# Patient Record
Sex: Male | Born: 1950
Health system: Southern US, Community
[De-identification: ages and names within clinical notes are randomized; demographics above are authoritative.]

## PROBLEM LIST (undated history)

## (undated) DIAGNOSIS — Z87898 Personal history of other specified conditions: Secondary | ICD-10-CM

## (undated) DIAGNOSIS — I712 Thoracic aortic aneurysm, without rupture: Secondary | ICD-10-CM

## (undated) DIAGNOSIS — E785 Hyperlipidemia, unspecified: Secondary | ICD-10-CM

## (undated) HISTORY — DX: Personal history of other specified conditions: Z87.898

## (undated) HISTORY — DX: Hyperlipidemia, unspecified: E78.5

## (undated) HISTORY — PX: HEMORROIDECTOMY: SUR656

## (undated) HISTORY — PX: OTHER SURGICAL HISTORY: SHX169

## (undated) HISTORY — PX: WISDOM TOOTH EXTRACTION: SHX21

---
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
## 2002-09-20 ENCOUNTER — Ambulatory Visit (HOSPITAL_COMMUNITY): Admission: RE | Admit: 2002-09-20 | Discharge: 2002-09-20 | Payer: Self-pay | Admitting: Gastroenterology

## 2014-12-20 DIAGNOSIS — R69 Illness, unspecified: Secondary | ICD-10-CM | POA: Diagnosis not present

## 2014-12-27 DIAGNOSIS — Z23 Encounter for immunization: Secondary | ICD-10-CM | POA: Diagnosis not present

## 2015-01-23 DIAGNOSIS — Z125 Encounter for screening for malignant neoplasm of prostate: Secondary | ICD-10-CM | POA: Diagnosis not present

## 2015-01-23 DIAGNOSIS — Z23 Encounter for immunization: Secondary | ICD-10-CM | POA: Diagnosis not present

## 2015-01-23 DIAGNOSIS — Z1389 Encounter for screening for other disorder: Secondary | ICD-10-CM | POA: Diagnosis not present

## 2015-01-23 DIAGNOSIS — E7800 Pure hypercholesterolemia, unspecified: Secondary | ICD-10-CM | POA: Diagnosis not present

## 2015-01-23 DIAGNOSIS — R7309 Other abnormal glucose: Secondary | ICD-10-CM | POA: Diagnosis not present

## 2015-01-23 DIAGNOSIS — R358 Other polyuria: Secondary | ICD-10-CM | POA: Diagnosis not present

## 2015-01-23 DIAGNOSIS — R350 Frequency of micturition: Secondary | ICD-10-CM | POA: Diagnosis not present

## 2015-04-11 DIAGNOSIS — D225 Melanocytic nevi of trunk: Secondary | ICD-10-CM | POA: Diagnosis not present

## 2015-04-11 DIAGNOSIS — L570 Actinic keratosis: Secondary | ICD-10-CM | POA: Diagnosis not present

## 2015-04-11 DIAGNOSIS — L723 Sebaceous cyst: Secondary | ICD-10-CM | POA: Diagnosis not present

## 2015-04-11 DIAGNOSIS — Z23 Encounter for immunization: Secondary | ICD-10-CM | POA: Diagnosis not present

## 2015-04-11 DIAGNOSIS — D1800 Hemangioma unspecified site: Secondary | ICD-10-CM | POA: Diagnosis not present

## 2015-04-11 DIAGNOSIS — L814 Other melanin hyperpigmentation: Secondary | ICD-10-CM | POA: Diagnosis not present

## 2015-04-11 DIAGNOSIS — L821 Other seborrheic keratosis: Secondary | ICD-10-CM | POA: Diagnosis not present

## 2015-05-03 DIAGNOSIS — R69 Illness, unspecified: Secondary | ICD-10-CM | POA: Diagnosis not present

## 2015-05-04 DIAGNOSIS — R69 Illness, unspecified: Secondary | ICD-10-CM | POA: Diagnosis not present

## 2015-09-05 DIAGNOSIS — R69 Illness, unspecified: Secondary | ICD-10-CM | POA: Diagnosis not present

## 2015-12-27 DIAGNOSIS — R69 Illness, unspecified: Secondary | ICD-10-CM | POA: Diagnosis not present

## 2015-12-28 DIAGNOSIS — R69 Illness, unspecified: Secondary | ICD-10-CM | POA: Diagnosis not present

## 2016-01-23 DIAGNOSIS — E7800 Pure hypercholesterolemia, unspecified: Secondary | ICD-10-CM | POA: Diagnosis not present

## 2016-01-23 DIAGNOSIS — Z23 Encounter for immunization: Secondary | ICD-10-CM | POA: Diagnosis not present

## 2016-01-23 DIAGNOSIS — R7309 Other abnormal glucose: Secondary | ICD-10-CM | POA: Diagnosis not present

## 2016-01-23 DIAGNOSIS — Z1389 Encounter for screening for other disorder: Secondary | ICD-10-CM | POA: Diagnosis not present

## 2016-01-23 DIAGNOSIS — Z125 Encounter for screening for malignant neoplasm of prostate: Secondary | ICD-10-CM | POA: Diagnosis not present

## 2016-01-23 DIAGNOSIS — R350 Frequency of micturition: Secondary | ICD-10-CM | POA: Diagnosis not present

## 2016-01-23 DIAGNOSIS — R358 Other polyuria: Secondary | ICD-10-CM | POA: Diagnosis not present

## 2016-04-30 DIAGNOSIS — R69 Illness, unspecified: Secondary | ICD-10-CM | POA: Diagnosis not present

## 2016-05-20 DIAGNOSIS — L7211 Pilar cyst: Secondary | ICD-10-CM | POA: Diagnosis not present

## 2016-05-20 DIAGNOSIS — D225 Melanocytic nevi of trunk: Secondary | ICD-10-CM | POA: Diagnosis not present

## 2016-05-20 DIAGNOSIS — L821 Other seborrheic keratosis: Secondary | ICD-10-CM | POA: Diagnosis not present

## 2016-05-20 DIAGNOSIS — Q825 Congenital non-neoplastic nevus: Secondary | ICD-10-CM | POA: Diagnosis not present

## 2016-05-20 DIAGNOSIS — D1800 Hemangioma unspecified site: Secondary | ICD-10-CM | POA: Diagnosis not present

## 2016-05-20 DIAGNOSIS — L814 Other melanin hyperpigmentation: Secondary | ICD-10-CM | POA: Diagnosis not present

## 2016-07-10 DIAGNOSIS — Z79899 Other long term (current) drug therapy: Secondary | ICD-10-CM | POA: Diagnosis not present

## 2016-07-10 DIAGNOSIS — E785 Hyperlipidemia, unspecified: Secondary | ICD-10-CM | POA: Diagnosis not present

## 2016-07-10 DIAGNOSIS — N329 Bladder disorder, unspecified: Secondary | ICD-10-CM | POA: Diagnosis not present

## 2016-07-10 DIAGNOSIS — Z87891 Personal history of nicotine dependence: Secondary | ICD-10-CM | POA: Diagnosis not present

## 2016-07-10 DIAGNOSIS — Z0000 Encounter for general adult medical examination without abnormal findings: Secondary | ICD-10-CM | POA: Diagnosis not present

## 2016-08-27 DIAGNOSIS — R69 Illness, unspecified: Secondary | ICD-10-CM | POA: Diagnosis not present

## 2016-11-20 DIAGNOSIS — R69 Illness, unspecified: Secondary | ICD-10-CM | POA: Diagnosis not present

## 2016-12-31 DIAGNOSIS — R69 Illness, unspecified: Secondary | ICD-10-CM | POA: Diagnosis not present

## 2017-01-23 DIAGNOSIS — Z125 Encounter for screening for malignant neoplasm of prostate: Secondary | ICD-10-CM | POA: Diagnosis not present

## 2017-01-23 DIAGNOSIS — R358 Other polyuria: Secondary | ICD-10-CM | POA: Diagnosis not present

## 2017-01-23 DIAGNOSIS — L723 Sebaceous cyst: Secondary | ICD-10-CM | POA: Diagnosis not present

## 2017-01-23 DIAGNOSIS — Z1389 Encounter for screening for other disorder: Secondary | ICD-10-CM | POA: Diagnosis not present

## 2017-01-23 DIAGNOSIS — R7309 Other abnormal glucose: Secondary | ICD-10-CM | POA: Diagnosis not present

## 2017-01-23 DIAGNOSIS — R350 Frequency of micturition: Secondary | ICD-10-CM | POA: Diagnosis not present

## 2017-01-23 DIAGNOSIS — E7800 Pure hypercholesterolemia, unspecified: Secondary | ICD-10-CM | POA: Diagnosis not present

## 2017-01-30 DIAGNOSIS — L720 Epidermal cyst: Secondary | ICD-10-CM | POA: Diagnosis not present

## 2017-02-18 ENCOUNTER — Other Ambulatory Visit: Payer: Self-pay | Admitting: General Surgery

## 2017-02-18 DIAGNOSIS — L723 Sebaceous cyst: Secondary | ICD-10-CM | POA: Diagnosis not present

## 2017-02-18 DIAGNOSIS — L720 Epidermal cyst: Secondary | ICD-10-CM | POA: Diagnosis not present

## 2017-02-18 DIAGNOSIS — L7212 Trichodermal cyst: Secondary | ICD-10-CM | POA: Diagnosis not present

## 2017-04-17 DIAGNOSIS — Z961 Presence of intraocular lens: Secondary | ICD-10-CM | POA: Diagnosis not present

## 2017-05-13 DIAGNOSIS — R69 Illness, unspecified: Secondary | ICD-10-CM | POA: Diagnosis not present

## 2017-05-21 DIAGNOSIS — L570 Actinic keratosis: Secondary | ICD-10-CM | POA: Diagnosis not present

## 2017-05-21 DIAGNOSIS — L821 Other seborrheic keratosis: Secondary | ICD-10-CM | POA: Diagnosis not present

## 2017-05-21 DIAGNOSIS — Q825 Congenital non-neoplastic nevus: Secondary | ICD-10-CM | POA: Diagnosis not present

## 2017-05-21 DIAGNOSIS — D1800 Hemangioma unspecified site: Secondary | ICD-10-CM | POA: Diagnosis not present

## 2017-05-21 DIAGNOSIS — L814 Other melanin hyperpigmentation: Secondary | ICD-10-CM | POA: Diagnosis not present

## 2017-05-21 DIAGNOSIS — D225 Melanocytic nevi of trunk: Secondary | ICD-10-CM | POA: Diagnosis not present

## 2017-09-29 DIAGNOSIS — H903 Sensorineural hearing loss, bilateral: Secondary | ICD-10-CM | POA: Diagnosis not present

## 2017-10-02 DIAGNOSIS — R69 Illness, unspecified: Secondary | ICD-10-CM | POA: Diagnosis not present

## 2018-01-03 DIAGNOSIS — R69 Illness, unspecified: Secondary | ICD-10-CM | POA: Diagnosis not present

## 2018-01-27 DIAGNOSIS — Z1389 Encounter for screening for other disorder: Secondary | ICD-10-CM | POA: Diagnosis not present

## 2018-01-27 DIAGNOSIS — R358 Other polyuria: Secondary | ICD-10-CM | POA: Diagnosis not present

## 2018-01-27 DIAGNOSIS — E7800 Pure hypercholesterolemia, unspecified: Secondary | ICD-10-CM | POA: Diagnosis not present

## 2018-01-27 DIAGNOSIS — Z125 Encounter for screening for malignant neoplasm of prostate: Secondary | ICD-10-CM | POA: Diagnosis not present

## 2018-01-27 DIAGNOSIS — R350 Frequency of micturition: Secondary | ICD-10-CM | POA: Diagnosis not present

## 2018-01-27 DIAGNOSIS — R7309 Other abnormal glucose: Secondary | ICD-10-CM | POA: Diagnosis not present

## 2018-02-12 DIAGNOSIS — R69 Illness, unspecified: Secondary | ICD-10-CM | POA: Diagnosis not present

## 2018-03-09 ENCOUNTER — Other Ambulatory Visit: Payer: Self-pay | Admitting: Family Medicine

## 2018-03-09 ENCOUNTER — Other Ambulatory Visit (HOSPITAL_COMMUNITY): Payer: Self-pay | Admitting: Family Medicine

## 2018-03-09 DIAGNOSIS — Z8249 Family history of ischemic heart disease and other diseases of the circulatory system: Secondary | ICD-10-CM

## 2018-03-11 ENCOUNTER — Encounter (INDEPENDENT_AMBULATORY_CARE_PROVIDER_SITE_OTHER): Payer: Self-pay

## 2018-03-11 ENCOUNTER — Ambulatory Visit (HOSPITAL_COMMUNITY): Payer: Medicare HMO | Attending: Cardiology

## 2018-03-11 DIAGNOSIS — I712 Thoracic aortic aneurysm, without rupture: Secondary | ICD-10-CM | POA: Insufficient documentation

## 2018-03-11 DIAGNOSIS — E785 Hyperlipidemia, unspecified: Secondary | ICD-10-CM | POA: Insufficient documentation

## 2018-03-11 DIAGNOSIS — Z8249 Family history of ischemic heart disease and other diseases of the circulatory system: Secondary | ICD-10-CM | POA: Insufficient documentation

## 2018-03-17 ENCOUNTER — Other Ambulatory Visit: Payer: Self-pay | Admitting: Family Medicine

## 2018-03-17 DIAGNOSIS — R931 Abnormal findings on diagnostic imaging of heart and coronary circulation: Secondary | ICD-10-CM

## 2018-03-20 ENCOUNTER — Ambulatory Visit
Admission: RE | Admit: 2018-03-20 | Discharge: 2018-03-20 | Disposition: A | Discharge status: Home / Self Care | Payer: Medicare HMO | Source: Ambulatory Visit | Attending: Family Medicine | Admitting: Family Medicine

## 2018-03-20 DIAGNOSIS — R931 Abnormal findings on diagnostic imaging of heart and coronary circulation: Secondary | ICD-10-CM

## 2018-03-20 DIAGNOSIS — I712 Thoracic aortic aneurysm, without rupture: Secondary | ICD-10-CM | POA: Diagnosis not present

## 2018-03-20 MED ORDER — IOPAMIDOL (ISOVUE-370) INJECTION 76%
75.0000 mL | Freq: Once | INTRAVENOUS | Status: AC | PRN
  Administered 2018-03-20: 75 mL via INTRAVENOUS

## 2018-04-15 ENCOUNTER — Institutional Professional Consult (permissible substitution): Payer: Medicare HMO | Admitting: Cardiothoracic Surgery

## 2018-04-15 ENCOUNTER — Other Ambulatory Visit: Payer: Self-pay

## 2018-04-15 ENCOUNTER — Encounter: Payer: Self-pay | Admitting: Cardiothoracic Surgery

## 2018-04-15 VITALS — BP 129/90 | HR 70 | Resp 16 | Ht 73.0 in | Wt 210.0 lb

## 2018-04-15 DIAGNOSIS — I712 Thoracic aortic aneurysm, without rupture, unspecified: Secondary | ICD-10-CM
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
## 2018-04-16 ENCOUNTER — Encounter: Payer: Self-pay | Admitting: Family Medicine

## 2018-04-20 DIAGNOSIS — Z961 Presence of intraocular lens: Secondary | ICD-10-CM | POA: Diagnosis not present

## 2018-04-23 DIAGNOSIS — Z86010 Personal history of colonic polyps: Secondary | ICD-10-CM | POA: Diagnosis not present

## 2018-04-23 DIAGNOSIS — K5730 Diverticulosis of large intestine without perforation or abscess without bleeding: Secondary | ICD-10-CM | POA: Diagnosis not present

## 2018-04-23 DIAGNOSIS — Z1211 Encounter for screening for malignant neoplasm of colon: Secondary | ICD-10-CM | POA: Diagnosis not present

## 2018-07-22 DIAGNOSIS — Z0100 Encounter for examination of eyes and vision without abnormal findings: Secondary | ICD-10-CM | POA: Diagnosis not present

## 2018-08-18 ENCOUNTER — Other Ambulatory Visit: Payer: Self-pay | Admitting: Cardiothoracic Surgery

## 2018-08-18 DIAGNOSIS — I712 Thoracic aortic aneurysm, without rupture, unspecified: Secondary | ICD-10-CM

## 2018-08-25 DIAGNOSIS — R69 Illness, unspecified: Secondary | ICD-10-CM | POA: Diagnosis not present

## 2018-09-26 ENCOUNTER — Encounter: Payer: Self-pay | Admitting: Cardiothoracic Surgery

## 2018-09-30 ENCOUNTER — Ambulatory Visit
Admission: RE | Admit: 2018-09-30 | Discharge: 2018-09-30 | Disposition: A | Discharge status: Home / Self Care | Payer: Medicare HMO | Source: Ambulatory Visit | Attending: Cardiothoracic Surgery | Admitting: Cardiothoracic Surgery

## 2018-09-30 ENCOUNTER — Encounter: Payer: Self-pay | Admitting: Cardiothoracic Surgery

## 2018-09-30 ENCOUNTER — Other Ambulatory Visit: Payer: Self-pay

## 2018-09-30 ENCOUNTER — Ambulatory Visit: Payer: Medicare HMO | Admitting: Cardiothoracic Surgery

## 2018-09-30 DIAGNOSIS — I712 Thoracic aortic aneurysm, without rupture, unspecified: Secondary | ICD-10-CM

## 2018-09-30 DIAGNOSIS — I7121 Aneurysm of the ascending aorta, without rupture: Secondary | ICD-10-CM | POA: Insufficient documentation

## 2018-09-30 HISTORY — DX: Aneurysm of the ascending aorta, without rupture: I71.21

## 2018-09-30 HISTORY — DX: Thoracic aortic aneurysm, without rupture: I71.2

## 2018-09-30 MED ORDER — IOPAMIDOL (ISOVUE-370) INJECTION 76%
75.0000 mL | Freq: Once | INTRAVENOUS | Status: AC | PRN
  Administered 2018-09-30: 75 mL via INTRAVENOUS

## 2018-09-30 NOTE — Progress Notes (Signed)
PCP is Gaynelle Arabian, MD Referring Provider is Gaynelle Arabian, MD  Chief Complaint  Patient presents with  . Thoracic Aortic Aneurysm    5 month f/u with CTA chest    HPI: The patient returns with CTA of the aorta for 54-month follow-up of a 4.9 cm asymptomatic fusiform aneurysm, first noted earlier this year.  He was first noted on a screening echocardiogram.  The ascending aorta shows no thickening hematoma or ulceration.  The aortic valve on echo was trileaflet without aortic insufficiency.  Left ventricular function is normal.  Patient does not have hypertension and does not smoke.  Patient  fhas  BSA of 2.3 and risk for dissection is approximately 1%.  Today's CT scan images are personally reviewed.  There is been no change in the diameter of his ascending aorta remains at 4.9 cm.  Past Medical History:  Diagnosis Date  . H/O urinary frequency   . Hyperlipidemia     Past Surgical History:  Procedure Laterality Date  . Excision of epidermoid cyst on neck    . HEMORROIDECTOMY    . WISDOM TOOTH EXTRACTION      Family History  Problem Relation Age of Onset  . Anemia Mother   . Leukemia Mother   . Arthritis Mother   . Cancer Sister     Social History Social History   Tobacco Use  . Smoking status: Former Smoker    Packs/day: 1.00    Years: 19.00    Pack years: 19.00    Types: Cigarettes    Quit date: 04/15/1982    Years since quitting: 36.4  . Smokeless tobacco: Never Used  Substance Use Topics  . Alcohol use: Yes    Comment: FEW BEERS A WEEK  . Drug use: Not on file    Current Outpatient Medications  Medication Sig Dispense Refill  . aspirin EC 81 MG tablet Take 81 mg by mouth daily.    Marland Kitchen oxybutynin (DITROPAN) 5 MG tablet Take 5 mg by mouth 2 (two) times daily.    . simvastatin (ZOCOR) 40 MG tablet Take 40 mg by mouth daily.    Marland Kitchen VITAMIN D PO Take by mouth 3 (three) times daily.     No current facility-administered medications for this visit.     No  Known Allergies  Review of Systems  No chest pain No shortness of breath Recently retired Engineer, manufacturing systems frequently No leg pain ankle swelling or claudication No abdominal pain.  BP 131/80 (BP Location: Right Arm, Patient Position: Sitting, Cuff Size: Normal)   Pulse (!) 58   Resp 18   Ht 6\' 1"  (1.854 m)   Wt 220 lb (99.8 kg)   SpO2 93% Comment: RA  BMI 29.03 kg/m  Physical Exam      Exam    General- alert and comfortable    Neck- no JVD, no cervical adenopathy palpable, no carotid bruit   Lungs- clear without rales, wheezes   Cor- regular rate and rhythm, no murmur , gallop   Abdomen- soft, non-tender   Extremities - warm, non-tender, minimal edema   Neuro- oriented, appropriate, no focal weakness   Diagnostic Tests: CT images personally reviewed showing the ascending aorta to measure 4.9 cm in diameter, consistent with a moderate fusiform aneurysm.  Impression: Asymptomatic moderate 4.9 cm fusiform aneurysm which is probably been stable for several years. Risk for dissection is.  very low approximately 1% Patient does not meet indications for surgery unless diameter exceeds 5.5. Best therapy is  good blood pressure control maintaining systolic pressure less than 150 and serial surveillance scans Patient will monitor his blood pressure and keep records for his primary care physician. I will plan a follow-up CT scan with office visit in a year.  The patient understands i that he should avoid the antibiotic Levaquin since it has a detrimental effect on connective tissue and could hasten the expansion of his aortic diameter.  He will also avoid heavy lifting. Return in 1 year with CTA of thoracic aorta.   Len Childs, MD [facility] [phone]

## 2018-10-13 DIAGNOSIS — R69 Illness, unspecified: Secondary | ICD-10-CM | POA: Diagnosis not present

## 2018-11-16 ENCOUNTER — Other Ambulatory Visit: Payer: Self-pay | Admitting: Registered"

## 2018-11-16 DIAGNOSIS — Z20822 Contact with and (suspected) exposure to covid-19: Secondary | ICD-10-CM

## 2018-11-16 DIAGNOSIS — Z20828 Contact with and (suspected) exposure to other viral communicable diseases: Secondary | ICD-10-CM | POA: Diagnosis not present

## 2018-11-18 LAB — NOVEL CORONAVIRUS, NAA: SARS-CoV-2, NAA: NOT DETECTED

## 2019-01-14 DIAGNOSIS — R69 Illness, unspecified: Secondary | ICD-10-CM | POA: Diagnosis not present

## 2019-02-03 DIAGNOSIS — Z0000 Encounter for general adult medical examination without abnormal findings: Secondary | ICD-10-CM | POA: Diagnosis not present

## 2019-02-03 DIAGNOSIS — E7800 Pure hypercholesterolemia, unspecified: Secondary | ICD-10-CM | POA: Diagnosis not present

## 2019-02-03 DIAGNOSIS — Z125 Encounter for screening for malignant neoplasm of prostate: Secondary | ICD-10-CM | POA: Diagnosis not present

## 2019-02-03 DIAGNOSIS — R7309 Other abnormal glucose: Secondary | ICD-10-CM | POA: Diagnosis not present

## 2019-02-03 DIAGNOSIS — Z79899 Other long term (current) drug therapy: Secondary | ICD-10-CM | POA: Diagnosis not present

## 2019-02-03 DIAGNOSIS — R350 Frequency of micturition: Secondary | ICD-10-CM | POA: Diagnosis not present

## 2019-02-04 DIAGNOSIS — Z125 Encounter for screening for malignant neoplasm of prostate: Secondary | ICD-10-CM | POA: Diagnosis not present

## 2019-02-04 DIAGNOSIS — Z79899 Other long term (current) drug therapy: Secondary | ICD-10-CM | POA: Diagnosis not present

## 2019-02-04 DIAGNOSIS — E7800 Pure hypercholesterolemia, unspecified: Secondary | ICD-10-CM | POA: Diagnosis not present

## 2019-02-04 DIAGNOSIS — R7309 Other abnormal glucose: Secondary | ICD-10-CM | POA: Diagnosis not present

## 2019-02-15 DIAGNOSIS — R69 Illness, unspecified: Secondary | ICD-10-CM | POA: Diagnosis not present

## 2019-02-23 DIAGNOSIS — L570 Actinic keratosis: Secondary | ICD-10-CM | POA: Diagnosis not present

## 2019-02-23 DIAGNOSIS — Q825 Congenital non-neoplastic nevus: Secondary | ICD-10-CM | POA: Diagnosis not present

## 2019-02-23 DIAGNOSIS — Z23 Encounter for immunization: Secondary | ICD-10-CM | POA: Diagnosis not present

## 2019-02-23 DIAGNOSIS — L578 Other skin changes due to chronic exposure to nonionizing radiation: Secondary | ICD-10-CM | POA: Diagnosis not present

## 2019-02-23 DIAGNOSIS — L814 Other melanin hyperpigmentation: Secondary | ICD-10-CM | POA: Diagnosis not present

## 2019-02-23 DIAGNOSIS — D225 Melanocytic nevi of trunk: Secondary | ICD-10-CM | POA: Diagnosis not present

## 2019-02-23 DIAGNOSIS — L821 Other seborrheic keratosis: Secondary | ICD-10-CM | POA: Diagnosis not present

## 2019-03-10 ENCOUNTER — Ambulatory Visit: Payer: Medicare HMO

## 2019-03-18 ENCOUNTER — Ambulatory Visit: Payer: Medicare HMO | Attending: Internal Medicine

## 2019-03-18 DIAGNOSIS — Z23 Encounter for immunization: Secondary | ICD-10-CM | POA: Insufficient documentation

## 2019-03-18 NOTE — Progress Notes (Signed)
   Covid-19 Vaccination Clinic  Name:  Shaun Vincent    MRN: [id number] DOB: February 27, 1950  03/18/2019  Shaun Vincent was observed post Covid-19 immunization for 15 minutes without incidence. He was provided with Vaccine Information Sheet and instruction to access the V-Safe system.   Shaun Vincent was instructed to call 911 with any severe reactions post vaccine: Marland Kitchen Difficulty breathing  . Swelling of your face and throat  . A fast heartbeat  . A bad rash all over your body  . Dizziness and weakness    Immunizations Administered    Name Date Dose VIS Date Route   Pfizer COVID-19 Vaccine 03/18/2019  3:01 PM 0.3 mL 01/22/2019 Intramuscular   Manufacturer: [place]   Lot: [id number]   [place]: [id number]

## 2019-03-27 ENCOUNTER — Ambulatory Visit: Payer: Medicare HMO

## 2019-04-08 ENCOUNTER — Ambulatory Visit (INDEPENDENT_AMBULATORY_CARE_PROVIDER_SITE_OTHER): Payer: Medicare HMO | Admitting: Psychology

## 2019-04-08 DIAGNOSIS — F411 Generalized anxiety disorder: Secondary | ICD-10-CM | POA: Diagnosis not present

## 2019-04-08 DIAGNOSIS — R69 Illness, unspecified: Secondary | ICD-10-CM | POA: Diagnosis not present

## 2019-04-13 ENCOUNTER — Ambulatory Visit: Payer: Medicare HMO | Attending: Internal Medicine

## 2019-04-13 DIAGNOSIS — Z23 Encounter for immunization: Secondary | ICD-10-CM | POA: Insufficient documentation

## 2019-04-13 NOTE — Progress Notes (Signed)
   Covid-19 Vaccination Clinic  Name:  Shaun Vincent    MRN: [id number] DOB: 11-Apr-1950  04/13/2019  [name] was observed post Covid-19 immunization for 15 minutes without incident. He was provided with Vaccine Information Sheet and instruction to access the V-Safe system.   [name] was instructed to call 911 with any severe reactions post vaccine: Marland Kitchen Difficulty breathing  . Swelling of face and throat  . A fast heartbeat  . A bad rash all over body  . Dizziness and weakness   Immunizations Administered    Name Date Dose VIS Date Route   Pfizer COVID-19 Vaccine 04/13/2019  9:50 AM 0.3 mL 01/22/2019 Intramuscular   Manufacturer: [place]   Lot: [id number]   [place]: [id number]

## 2019-04-22 ENCOUNTER — Ambulatory Visit (INDEPENDENT_AMBULATORY_CARE_PROVIDER_SITE_OTHER): Payer: Medicare HMO | Admitting: Psychology

## 2019-04-22 DIAGNOSIS — F411 Generalized anxiety disorder: Secondary | ICD-10-CM

## 2019-04-22 DIAGNOSIS — R69 Illness, unspecified: Secondary | ICD-10-CM | POA: Diagnosis not present

## 2019-05-05 ENCOUNTER — Ambulatory Visit (INDEPENDENT_AMBULATORY_CARE_PROVIDER_SITE_OTHER): Payer: Medicare HMO | Admitting: Psychology

## 2019-05-05 DIAGNOSIS — F411 Generalized anxiety disorder: Secondary | ICD-10-CM | POA: Diagnosis not present

## 2019-05-05 DIAGNOSIS — R69 Illness, unspecified: Secondary | ICD-10-CM | POA: Diagnosis not present

## 2019-05-19 ENCOUNTER — Ambulatory Visit (INDEPENDENT_AMBULATORY_CARE_PROVIDER_SITE_OTHER): Payer: Medicare HMO | Admitting: Psychology

## 2019-05-19 DIAGNOSIS — R69 Illness, unspecified: Secondary | ICD-10-CM | POA: Diagnosis not present

## 2019-05-19 DIAGNOSIS — F411 Generalized anxiety disorder: Secondary | ICD-10-CM

## 2019-05-20 DIAGNOSIS — R69 Illness, unspecified: Secondary | ICD-10-CM | POA: Diagnosis not present

## 2019-06-02 ENCOUNTER — Ambulatory Visit: Payer: Medicare HMO | Admitting: Psychology

## 2019-06-17 ENCOUNTER — Ambulatory Visit (INDEPENDENT_AMBULATORY_CARE_PROVIDER_SITE_OTHER): Payer: Medicare HMO | Admitting: Psychology

## 2019-06-17 DIAGNOSIS — F419 Anxiety disorder, unspecified: Secondary | ICD-10-CM | POA: Diagnosis not present

## 2019-06-17 DIAGNOSIS — R69 Illness, unspecified: Secondary | ICD-10-CM | POA: Diagnosis not present

## 2019-07-19 ENCOUNTER — Ambulatory Visit: Payer: Medicare HMO | Admitting: Psychology

## 2019-07-21 DIAGNOSIS — R69 Illness, unspecified: Secondary | ICD-10-CM | POA: Diagnosis not present

## 2019-07-29 DIAGNOSIS — R69 Illness, unspecified: Secondary | ICD-10-CM | POA: Diagnosis not present

## 2019-08-09 ENCOUNTER — Ambulatory Visit (INDEPENDENT_AMBULATORY_CARE_PROVIDER_SITE_OTHER): Payer: Medicare HMO | Admitting: Psychology

## 2019-08-09 DIAGNOSIS — R69 Illness, unspecified: Secondary | ICD-10-CM | POA: Diagnosis not present

## 2019-08-09 DIAGNOSIS — F419 Anxiety disorder, unspecified: Secondary | ICD-10-CM

## 2019-08-30 ENCOUNTER — Ambulatory Visit: Payer: Medicare HMO | Admitting: Psychology

## 2019-09-13 DIAGNOSIS — R69 Illness, unspecified: Secondary | ICD-10-CM | POA: Diagnosis not present

## 2019-10-05 ENCOUNTER — Ambulatory Visit: Payer: Self-pay | Admitting: Psychology

## 2019-10-13 ENCOUNTER — Other Ambulatory Visit: Payer: Self-pay | Admitting: *Deleted

## 2019-10-13 DIAGNOSIS — I712 Thoracic aortic aneurysm, without rupture, unspecified: Secondary | ICD-10-CM

## 2019-10-25 DIAGNOSIS — H43812 Vitreous degeneration, left eye: Secondary | ICD-10-CM | POA: Diagnosis not present

## 2019-11-09 DIAGNOSIS — Z23 Encounter for immunization: Secondary | ICD-10-CM | POA: Diagnosis not present

## 2019-11-09 DIAGNOSIS — R69 Illness, unspecified: Secondary | ICD-10-CM | POA: Diagnosis not present

## 2019-11-10 ENCOUNTER — Other Ambulatory Visit: Payer: Self-pay

## 2019-11-10 ENCOUNTER — Ambulatory Visit
Admission: RE | Admit: 2019-11-10 | Discharge: 2019-11-10 | Disposition: A | Discharge status: Home / Self Care | Payer: Medicare HMO | Source: Ambulatory Visit | Attending: Cardiothoracic Surgery | Admitting: Cardiothoracic Surgery

## 2019-11-10 ENCOUNTER — Ambulatory Visit (INDEPENDENT_AMBULATORY_CARE_PROVIDER_SITE_OTHER): Payer: Medicare HMO | Admitting: Cardiothoracic Surgery

## 2019-11-10 ENCOUNTER — Encounter: Payer: Self-pay | Admitting: Cardiothoracic Surgery

## 2019-11-10 VITALS — BP 113/68 | HR 84 | Temp 97.9°F | Resp 20 | Ht 73.0 in | Wt 218.8 lb

## 2019-11-10 DIAGNOSIS — I712 Thoracic aortic aneurysm, without rupture, unspecified: Secondary | ICD-10-CM

## 2019-11-10 DIAGNOSIS — I7090 Unspecified atherosclerosis: Secondary | ICD-10-CM | POA: Diagnosis not present

## 2019-11-10 DIAGNOSIS — K449 Diaphragmatic hernia without obstruction or gangrene: Secondary | ICD-10-CM | POA: Diagnosis not present

## 2019-11-10 DIAGNOSIS — N281 Cyst of kidney, acquired: Secondary | ICD-10-CM | POA: Diagnosis not present

## 2019-11-10 DIAGNOSIS — I7121 Aneurysm of the ascending aorta, without rupture: Secondary | ICD-10-CM

## 2019-11-10 DIAGNOSIS — J9811 Atelectasis: Secondary | ICD-10-CM | POA: Diagnosis not present

## 2019-11-10 MED ORDER — IOPAMIDOL (ISOVUE-370) INJECTION 76%
75.0000 mL | Freq: Once | INTRAVENOUS | Status: AC | PRN
  Administered 2019-11-10: 75 mL via INTRAVENOUS

## 2019-11-10 NOTE — Progress Notes (Signed)
PCP is Gaynelle Arabian, MD Referring Provider is Gaynelle Arabian, MD  Chief Complaint  Patient presents with  . Thoracic Aortic Aneurysm    1 year surveillance of thoracic aortic aneurysm, CTA 11/10/19    HPI: The patient returns for 1 year follow-up with CTA of his thoracic aorta.  He was noted to have an asymptomatic 4.9/5.0 cm fusiform ascending aorta with a tricuspid aortic valve on previous echocardiogram.  So far follow-up CTAs have shown no change.  His blood pressure is naturally low.  He is a non-smoker.  We discussed the fact that he should not take Levaquin.  I personally reviewed the scan performed today 1 year since the last study.  It shows no change with the ascending aorta fusiform aneurysm measuring 5.0 cm without mural thickening or ulceration.  The patient remains completely asymptomatic.  Past Medical History:  Diagnosis Date  . H/O urinary frequency   . Hyperlipidemia   . Thoracic ascending aortic aneurysm ([place]) 09/30/2018    Past Surgical History:  Procedure Laterality Date  . Excision of epidermoid cyst on neck    . HEMORROIDECTOMY    . WISDOM TOOTH EXTRACTION      Family History  Problem Relation Age of Onset  . Anemia Mother   . Leukemia Mother   . Arthritis Mother   . Cancer Sister     Social History Social History   Tobacco Use  . Smoking status: Former Smoker    Packs/day: 1.00    Years: 19.00    Pack years: 19.00    Types: Cigarettes    Quit date: 04/15/1982    Years since quitting: 37.5  . Smokeless tobacco: Never Used  Substance Use Topics  . Alcohol use: Yes    Comment: FEW BEERS A WEEK  . Drug use: Not on file    Current Outpatient Medications  Medication Sig Dispense Refill  . aspirin EC 81 MG tablet Take 81 mg by mouth daily.    Marland Kitchen oxybutynin (DITROPAN) 5 MG tablet Take 5 mg by mouth 2 (two) times daily.    . simvastatin (ZOCOR) 40 MG tablet Take 40 mg by mouth daily.    . Turmeric (QC TUMERIC COMPLEX PO) Take by mouth once.     Marland Kitchen VITAMIN D PO Take by mouth 3 (three) times daily.     No current facility-administered medications for this visit.    No Known Allergies  Review of Systems  Weight stable Avoids heavy lifting No chest pain shortness of breath or ankle edema No palpitations or presyncope No abdominal pain No difficulty swallowing  BP 113/68   Pulse 84   Temp 97.9 F (36.6 C)   Resp 20   Ht 6\' 1"  (1.854 m)   Wt 218 lb 12.8 oz (99.2 kg)   SpO2 93%   BMI 28.87 kg/m  Physical Exam      Exam    General- alert and comfortable    Neck- no JVD, no cervical adenopathy palpable, no carotid bruit   Lungs- clear without rales, wheezes   Cor- regular rate and rhythm, no murmur , gallop   Abdomen- soft, non-tender   Extremities - warm, non-tender, minimal edema   Neuro- oriented, appropriate, no focal weakness   Diagnostic Tests: Today's study personally reviewed showing no change in the asymptomatic fusiform ascending aneurysm-5.0 cm diameter.  Impression: Patient understands to avoid heavy lifting and to avoid Levaquin antibiotic.  He will return for follow-up scan in 1 year.  Len Childs, MD [facility] [phone]

## 2019-11-16 ENCOUNTER — Ambulatory Visit (INDEPENDENT_AMBULATORY_CARE_PROVIDER_SITE_OTHER): Payer: Medicare HMO | Admitting: Psychology

## 2019-11-16 DIAGNOSIS — F419 Anxiety disorder, unspecified: Secondary | ICD-10-CM

## 2019-11-16 DIAGNOSIS — R69 Illness, unspecified: Secondary | ICD-10-CM | POA: Diagnosis not present

## 2019-11-18 DIAGNOSIS — R69 Illness, unspecified: Secondary | ICD-10-CM | POA: Diagnosis not present

## 2019-11-24 ENCOUNTER — Encounter: Payer: Self-pay | Admitting: Cardiothoracic Surgery

## 2019-12-23 DIAGNOSIS — Z0100 Encounter for examination of eyes and vision without abnormal findings: Secondary | ICD-10-CM | POA: Diagnosis not present

## 2020-01-17 DIAGNOSIS — R69 Illness, unspecified: Secondary | ICD-10-CM | POA: Diagnosis not present

## 2020-02-01 DIAGNOSIS — M1990 Unspecified osteoarthritis, unspecified site: Secondary | ICD-10-CM | POA: Diagnosis not present

## 2020-02-01 DIAGNOSIS — I712 Thoracic aortic aneurysm, without rupture: Secondary | ICD-10-CM | POA: Diagnosis not present

## 2020-02-01 DIAGNOSIS — R7309 Other abnormal glucose: Secondary | ICD-10-CM | POA: Diagnosis not present

## 2020-02-01 DIAGNOSIS — E7800 Pure hypercholesterolemia, unspecified: Secondary | ICD-10-CM | POA: Diagnosis not present

## 2020-02-01 DIAGNOSIS — Z0000 Encounter for general adult medical examination without abnormal findings: Secondary | ICD-10-CM | POA: Diagnosis not present

## 2020-02-01 DIAGNOSIS — Z79899 Other long term (current) drug therapy: Secondary | ICD-10-CM | POA: Diagnosis not present

## 2020-02-01 DIAGNOSIS — R350 Frequency of micturition: Secondary | ICD-10-CM | POA: Diagnosis not present

## 2020-02-01 DIAGNOSIS — Z125 Encounter for screening for malignant neoplasm of prostate: Secondary | ICD-10-CM | POA: Diagnosis not present

## 2020-02-01 DIAGNOSIS — I700 Atherosclerosis of aorta: Secondary | ICD-10-CM | POA: Diagnosis not present

## 2020-02-08 DIAGNOSIS — U071 COVID-19: Secondary | ICD-10-CM | POA: Diagnosis not present

## 2020-03-08 DIAGNOSIS — L821 Other seborrheic keratosis: Secondary | ICD-10-CM | POA: Diagnosis not present

## 2020-03-08 DIAGNOSIS — L814 Other melanin hyperpigmentation: Secondary | ICD-10-CM | POA: Diagnosis not present

## 2020-03-08 DIAGNOSIS — L918 Other hypertrophic disorders of the skin: Secondary | ICD-10-CM | POA: Diagnosis not present

## 2020-03-08 DIAGNOSIS — L578 Other skin changes due to chronic exposure to nonionizing radiation: Secondary | ICD-10-CM | POA: Diagnosis not present

## 2020-03-08 DIAGNOSIS — Q825 Congenital non-neoplastic nevus: Secondary | ICD-10-CM | POA: Diagnosis not present

## 2020-03-08 DIAGNOSIS — D225 Melanocytic nevi of trunk: Secondary | ICD-10-CM | POA: Diagnosis not present

## 2020-06-08 DIAGNOSIS — Z20828 Contact with and (suspected) exposure to other viral communicable diseases: Secondary | ICD-10-CM | POA: Diagnosis not present

## 2020-07-11 DIAGNOSIS — Z1211 Encounter for screening for malignant neoplasm of colon: Secondary | ICD-10-CM | POA: Diagnosis not present

## 2020-07-11 DIAGNOSIS — Z86010 Personal history of colonic polyps: Secondary | ICD-10-CM | POA: Diagnosis not present

## 2020-07-11 DIAGNOSIS — K5730 Diverticulosis of large intestine without perforation or abscess without bleeding: Secondary | ICD-10-CM | POA: Diagnosis not present

## 2020-08-22 DIAGNOSIS — U071 COVID-19: Secondary | ICD-10-CM | POA: Diagnosis not present

## 2020-09-21 ENCOUNTER — Other Ambulatory Visit: Payer: Self-pay | Admitting: Surgery

## 2020-09-21 DIAGNOSIS — I712 Thoracic aortic aneurysm, without rupture, unspecified: Secondary | ICD-10-CM

## 2020-11-06 ENCOUNTER — Inpatient Hospital Stay: Admission: RE | Admit: 2020-11-06 | Payer: Self-pay | Source: Ambulatory Visit

## 2020-11-06 ENCOUNTER — Ambulatory Visit: Payer: Medicare HMO

## 2021-01-15 ENCOUNTER — Ambulatory Visit: Payer: Medicare HMO | Admitting: Physician Assistant

## 2021-01-15 ENCOUNTER — Ambulatory Visit
Admission: RE | Admit: 2021-01-15 | Discharge: 2021-01-15 | Disposition: A | Discharge status: Home / Self Care | Payer: Medicare HMO | Source: Ambulatory Visit | Attending: Surgery | Admitting: Surgery

## 2021-01-15 ENCOUNTER — Other Ambulatory Visit: Payer: Self-pay

## 2021-01-15 VITALS — BP 162/49 | HR 66 | Resp 20 | Wt 221.0 lb

## 2021-01-15 DIAGNOSIS — I7121 Aneurysm of the ascending aorta, without rupture: Secondary | ICD-10-CM | POA: Diagnosis not present

## 2021-01-15 DIAGNOSIS — I700 Atherosclerosis of aorta: Secondary | ICD-10-CM | POA: Diagnosis not present

## 2021-01-15 DIAGNOSIS — I712 Thoracic aortic aneurysm, without rupture, unspecified: Secondary | ICD-10-CM | POA: Diagnosis not present

## 2021-01-15 DIAGNOSIS — R911 Solitary pulmonary nodule: Secondary | ICD-10-CM | POA: Diagnosis not present

## 2021-01-15 MED ORDER — IOPAMIDOL (ISOVUE-370) INJECTION 76%
75.0000 mL | Freq: Once | INTRAVENOUS | Status: AC | PRN
  Administered 2021-01-15: 75 mL via INTRAVENOUS

## 2021-01-15 NOTE — Patient Instructions (Signed)
Patient is counseled regarding the importance of long term risk factor modification as they pertain to the presence of ischemic heart disease including avoiding the use of all tobacco products, dietary modifications and medical therapy for diabetes, cholesterol and lipid management, and regular exercise.

## 2021-01-15 NOTE — Progress Notes (Signed)
[address]       [place] [address]             [phone]     Shaun Vincent [id number] [birth year]  History of Present Illness:  Shaun Vincent is a 70 yo male with history of HLD and Ascending Aortic Aneurysm.  He has previously been followed by Dr. Prescott Gum and he presents today for his 1 year follow up.  Overall he is doing okay.  He does have some stressors outside of his appointment.  He denies chest pain, shortness of breath, and decreased energy level. He is a former smoker, but does continue to smoke cigars.  He is accompanied by his wife whom has concerns that anesthesia recently canceled his colonoscopy after finding out he has an aneurysm.  He had already completed the prep and the morning he presented for his study it was canceled.  His wife also questions his prognosis from his aneurysm.  His blood pressure is elevated on today's exam and he states his pressure is normally running 140s at home.  Current Outpatient Medications on File Prior to Visit  Medication Sig Dispense Refill   aspirin EC 81 MG tablet Take 81 mg by mouth daily.     Meloxicam 5 MG CAPS Take by mouth.     oxybutynin (DITROPAN) 5 MG tablet Take 5 mg by mouth 2 (two) times daily.     simvastatin (ZOCOR) 40 MG tablet Take 40 mg by mouth daily.     Turmeric (QC TUMERIC COMPLEX PO) Take by mouth once.     VITAMIN D PO Take by mouth 3 (three) times daily.     No current facility-administered medications on file prior to visit.   BP (!) 162/49 (BP Location: Left Arm, Patient Position: Sitting)   Pulse 66   Resp 20   Wt 221 lb (100.2 kg)   SpO2 95% Comment: RA  BMI 29.16 kg/m   Physical Exam  Gen: no apparent distress Heart: RRR Lungs: CTA bilaterally Abd: soft non-tender, non-distended Ext: no edema Neuro: grossly intact  CTA Results:  Stable examination. Maximal diameter of the ascending aorta measures 4.8 x 5.0 cm, unchanged since the initial study of 03/20/2018. Recommend  semi-annual imaging followup by CTA or MRA and referral to cardiothoracic surgery if not already obtained. This recommendation follows 2010 ACCF/AHA/AATS/ACR/ASA/SCA/SCAI/SIR/STS/SVM Guidelines for the Diagnosis and Management of Patients With Thoracic Aortic Disease. Circulation. 2010; 121: R916-B846. Aortic aneurysm NOS (ICD10-I71.9)   Aortic Atherosclerosis (ICD10-I70.0).   Tiny stable subpleural nodules, unchanged since 03/20/2018. No additional follow-up suggested based on appearance and stability.   Stable 2 cm low-density of the left lobe of the liver, certainly benign.  A/P:  Ascending Aortic Aneurysm- remains stable at 4.8 x 5.0 cm since 2020 HTN- patients BP elevated today, repeat was obtained and remained high in the 160s--- patient will likely need an antihypertensive agent... he does have an appointment with his family doctor on 12/22... if BP remains elevated he should be initiated on Antihypertensive agent with goal BP range of 110-120 HLD- continue Zocor RLL bleb- likely from previous nicotine abuse and continued cigar smoking, patient educated on what a bullae is and risks associated with rupture Canceled Colonscopy procedure-- aneurysm should not cause an increase risk to colonoscopy procedure, however will defer to Cardiology to perform evaluation to determine cardiac risk RTC in 1 year with repeat CTA chest/aorta, patient   Risk Modification:  Statin:  H/O HLD on  Zocor  Smoking cessation instruction/counseling given:  Patient former smoker, current cigar smoker educated on risk of continued use with bleb/aneurysm  Patient was counseled on importance of Blood Pressure Control.  Despite Medical intervention if the patient notices persistently elevated blood pressure readings.  They are instructed to contact their Primary Care Physician  Please avoid use of Fluoroquinolones as this can potentially increase your risk of Aortic Rupture and/or Dissection  Patient  educated on signs and symptoms of Aortic Dissection, handout also provided in AVS  Mariangel Ringley, PA-C 01/15/21

## 2021-01-16 DIAGNOSIS — H43812 Vitreous degeneration, left eye: Secondary | ICD-10-CM | POA: Diagnosis not present

## 2021-01-17 DIAGNOSIS — Z0100 Encounter for examination of eyes and vision without abnormal findings: Secondary | ICD-10-CM | POA: Diagnosis not present

## 2021-02-01 DIAGNOSIS — E7800 Pure hypercholesterolemia, unspecified: Secondary | ICD-10-CM | POA: Diagnosis not present

## 2021-02-01 DIAGNOSIS — Z0000 Encounter for general adult medical examination without abnormal findings: Secondary | ICD-10-CM | POA: Diagnosis not present

## 2021-02-01 DIAGNOSIS — Z125 Encounter for screening for malignant neoplasm of prostate: Secondary | ICD-10-CM | POA: Diagnosis not present

## 2021-02-01 DIAGNOSIS — I700 Atherosclerosis of aorta: Secondary | ICD-10-CM | POA: Diagnosis not present

## 2021-02-01 DIAGNOSIS — Z79899 Other long term (current) drug therapy: Secondary | ICD-10-CM | POA: Diagnosis not present

## 2021-02-01 DIAGNOSIS — I7121 Aneurysm of the ascending aorta, without rupture: Secondary | ICD-10-CM | POA: Diagnosis not present

## 2021-02-01 DIAGNOSIS — R350 Frequency of micturition: Secondary | ICD-10-CM | POA: Diagnosis not present

## 2021-02-01 DIAGNOSIS — Z1389 Encounter for screening for other disorder: Secondary | ICD-10-CM | POA: Diagnosis not present

## 2021-02-01 DIAGNOSIS — M1990 Unspecified osteoarthritis, unspecified site: Secondary | ICD-10-CM | POA: Diagnosis not present

## 2021-02-01 DIAGNOSIS — R7309 Other abnormal glucose: Secondary | ICD-10-CM | POA: Diagnosis not present

## 2021-03-05 ENCOUNTER — Ambulatory Visit: Payer: Medicare HMO | Admitting: Cardiology

## 2021-03-05 ENCOUNTER — Encounter: Payer: Self-pay | Admitting: Cardiology

## 2021-03-05 ENCOUNTER — Other Ambulatory Visit: Payer: Self-pay

## 2021-03-05 VITALS — BP 136/74 | HR 66 | Ht 73.0 in | Wt 221.2 lb

## 2021-03-05 DIAGNOSIS — E785 Hyperlipidemia, unspecified: Secondary | ICD-10-CM | POA: Diagnosis not present

## 2021-03-05 DIAGNOSIS — I10 Essential (primary) hypertension: Secondary | ICD-10-CM

## 2021-03-05 DIAGNOSIS — Z01818 Encounter for other preprocedural examination: Secondary | ICD-10-CM

## 2021-03-05 DIAGNOSIS — I7121 Aneurysm of the ascending aorta, without rupture: Secondary | ICD-10-CM

## 2021-03-05 MED ORDER — CARVEDILOL 6.25 MG PO TABS: 6.2500 mg | ORAL_TABLET | Freq: Two times a day (BID) | ORAL | 3 refills | Status: AC

## 2021-03-05 NOTE — Progress Notes (Signed)
Cardiology Office Note:    Date:  03/05/2021   ID:  [name], DOB [birth year], MRN [id number]  PCP:  Gaynelle Arabian, MD  Cardiologist:  Donato Heinz, MD  Electrophysiologist:  None   Referring MD: Gaynelle Arabian, MD   Chief Complaint  Patient presents with   Thoracic Aortic Aneurysm    History of Present Illness:    Shaun Vincent is a 71 y.o. male with a hx of thoracic ascending aortic aneurysm, hyperlipidemia, hypertension who is referred by Dr. Marisue Humble for evaluation of aortic aneurysm.  He follows with cardiac surgery, most recent CTA chest on 01/15/2021 showed stable ascending aorta measuring 4.8 x 5.0 cm.  Echo 03/11/18 showed ascneding aortic aneurysm, normal biventricular function, no significant valvular disease.  He reports he recently went for colonoscopy but was canceled due to concern for his aortic aneurysm.  His BP was in the 160s when he saw cardiac surgery.  Was recommended to follow-up with his PCP.  BP was in high 120s when he saw PCP and antihypertensive was not started.  Reports BP 130s over 70s at home.  He denies any chest pain, dyspnea, lightheadedness, syncope, lower extremity edema, or palpitations.  For exercise he plays golf and walks 3 to 4 days/week for 2 to 3 miles.  Can walk up 2 flights of stairs without stopping.  Denies any exertional symptoms.  He quit smoking cigarettes in 1984 but smokes occasional cigar on golf course.  Family history includes brother has aortic aneurysm.  Father had PPM at 15.   Past Medical History:  Diagnosis Date   H/O urinary frequency    Hyperlipidemia    Thoracic ascending aortic aneurysm 09/30/2018    Past Surgical History:  Procedure Laterality Date   Excision of epidermoid cyst on neck     HEMORROIDECTOMY     WISDOM TOOTH EXTRACTION      Current Medications: Current Meds  Medication Sig   aspirin EC 81 MG tablet Take 81 mg by mouth daily.   carvedilol (COREG) 6.25 MG tablet Take 1 tablet (6.25 mg total)  by mouth 2 (two) times daily.   Meloxicam 5 MG CAPS Take by mouth.   oxybutynin (DITROPAN) 5 MG tablet Take 5 mg by mouth 2 (two) times daily.   simvastatin (ZOCOR) 40 MG tablet Take 40 mg by mouth daily.   Turmeric (QC TUMERIC COMPLEX PO) Take by mouth once.   VITAMIN D PO Take by mouth 3 (three) times daily.     Allergies:   Patient has no known allergies.   Social History   Socioeconomic History   Marital status: Married    Spouse name: Not on file   Number of children: Not on file   Years of education: Not on file   Highest education level: Not on file  Occupational History   Not on file  Tobacco Use   Smoking status: Former    Packs/day: 1.00    Years: 19.00    Pack years: 19.00    Types: Cigarettes    Quit date: 04/15/1982    Years since quitting: 38.9   Smokeless tobacco: Never  Substance and Sexual Activity   Alcohol use: Yes    Comment: FEW BEERS A WEEK   Drug use: Not on file   Sexual activity: Not on file  Other Topics Concern   Not on file  Social History Narrative   Not on file   Social Determinants of Health   Financial Resource Strain: Not on  file  Food Insecurity: Not on file  Transportation Needs: Not on file  Physical Activity: Not on file  Stress: Not on file  Social Connections: Not on file     Family History: The patient's family history includes Anemia in his mother; Arthritis in his mother; Cancer in his sister; Leukemia in his mother.  ROS:   Please see the history of present illness.     All other systems reviewed and are negative.  EKGs/Labs/Other Studies Reviewed:    The following studies were reviewed today:   EKG:   03/05/20: NSR, rate 66  Recent Labs: No results found for requested labs within last 8760 hours.  Recent Lipid Panel No results found for: CHOL, TRIG, HDL, CHOLHDL, VLDL, LDLCALC, LDLDIRECT  Physical Exam:    VS:  BP 136/74    Pulse 66    Ht 6\' 1"  (1.854 m)    Wt 221 lb 3.2 oz (100.3 kg)    SpO2 97%    BMI  29.18 kg/m     Wt Readings from Last 3 Encounters:  03/05/21 221 lb 3.2 oz (100.3 kg)  01/15/21 221 lb (100.2 kg)  11/10/19 218 lb 12.8 oz (99.2 kg)     GEN:  Well nourished, well developed in no acute distress HEENT: Normal NECK: No JVD; No carotid bruits LYMPHATICS: No lymphadenopathy CARDIAC: RRR, no murmurs, rubs, gallops RESPIRATORY:  Clear to auscultation without rales, wheezing or rhonchi  ABDOMEN: Soft, non-tender, non-distended MUSCULOSKELETAL:  No edema; No deformity  SKIN: Warm and dry NEUROLOGIC:  Alert and oriented x 3 PSYCHIATRIC:  Normal affect   ASSESSMENT:    1. Pre-op evaluation   2. Aneurysm of ascending aorta without rupture   3. Essential hypertension   4. Hyperlipidemia, unspecified hyperlipidemia type    PLAN:     Preop evaluation: prior to colonoscopy.  Good functional capacity.  No exertional chest pain or dyspnea.  Low risk procedure.  RCRI score 0.   -No further cardiac work-up recommended prior to colonoscopy.  Ascending aortic aneurysm: He follows with cardiac surgery, most recent CTA chest on 01/15/2021 showed stable ascending aorta measuring 4.8 x 5.0 cm. -Check echo to evaluate for aortic regurgitation -Continue to follow with cardiac surgery  Hypertension: SBP 160s at last visit with cardiac surgery 01/2021.  Reports BP 130s over 80s at at home.  Goal SBP less than 120 and given his aortic aneurysm. -Recommend starting carvedilol 6.25 mg twice daily.  Asked to check BP and heart rate twice daily for next 2 weeks and call with results  Hyperlipidemia: On simvastatin 40 mg daily.  LDL 81 on 02/01/2021   RTC in 4 months    Medication Adjustments/Labs and Tests Ordered: Current medicines are reviewed at length with the patient today.  Concerns regarding medicines are outlined above.  Orders Placed This Encounter  Procedures   EKG 12-Lead   ECHOCARDIOGRAM COMPLETE   Meds ordered this encounter  Medications   carvedilol (COREG) 6.25  MG tablet    Sig: Take 1 tablet (6.25 mg total) by mouth 2 (two) times daily.    Dispense:  180 tablet    Refill:  3    Patient Instructions  Medication Instructions:   START: CARVEDILOL 6.25mg  TWICE DAILY   Please check your blood pressure and Heart rate at home daily, write it down.  Call the office or send message via Mychart with the readings in 2 weeks for Dr. Gardiner Rhyme to review.   *If you need a refill  on your cardiac medications before your next appointment, please call your pharmacy*  Testing/Procedures: Your physician has requested that you have an echocardiogram. Echocardiography is a painless test that uses sound waves to create images of your heart. It provides your doctor with information about the size and shape of your heart and how well your hearts chambers and valves are working. You may receive an ultrasound enhancing agent through an IV if needed to better visualize your heart during the echo.This procedure takes approximately one hour. There are no restrictions for this procedure. This will take place at the [address], [address].   Follow-Up: At [facility], you and your health needs are our priority.  As part of our continuing mission to provide you with exceptional heart care, we have created designated Provider Care Teams.  These Care Teams include your primary Cardiologist (physician) and Advanced Practice Providers (APPs -  Physician Assistants and Nurse Practitioners) who all work together to provide you with the care you need, when you need it.  Your next appointment:   MAY   The format for your next appointment:   In Person  Provider:   Donato Heinz, MD     Signed, Donato Heinz, MD  03/05/2021 9:10 AM    [place]

## 2021-03-05 NOTE — Patient Instructions (Addendum)
Medication Instructions:   START: CARVEDILOL 6.25mg  TWICE DAILY   Please check your blood pressure and Heart rate at home daily, write it down.  Call the office or send message via Mychart with the readings in 2 weeks for Dr. Gardiner Rhyme to review.   *If you need a refill on your cardiac medications before your next appointment, please call your pharmacy*  Testing/Procedures: Your physician has requested that you have an echocardiogram. Echocardiography is a painless test that uses sound waves to create images of your heart. It provides your doctor with information about the size and shape of your heart and how well your hearts chambers and valves are working. You may receive an ultrasound enhancing agent through an IV if needed to better visualize your heart during the echo.This procedure takes approximately one hour. There are no restrictions for this procedure. This will take place at the [address], [address].   Follow-Up: At [facility], you and your health needs are our priority.  As part of our continuing mission to provide you with exceptional heart care, we have created designated Provider Care Teams.  These Care Teams include your primary Cardiologist (physician) and Advanced Practice Providers (APPs -  Physician Assistants and Nurse Practitioners) who all work together to provide you with the care you need, when you need it.  Your next appointment:   MAY   The format for your next appointment:   In Person  Provider:   Donato Heinz, MD

## 2021-03-06 ENCOUNTER — Encounter: Payer: Self-pay | Admitting: Cardiology

## 2021-03-06 ENCOUNTER — Ambulatory Visit (HOSPITAL_COMMUNITY): Payer: Medicare HMO | Attending: Cardiology

## 2021-03-06 DIAGNOSIS — I7121 Aneurysm of the ascending aorta, without rupture: Secondary | ICD-10-CM | POA: Insufficient documentation

## 2021-03-06 LAB — ECHOCARDIOGRAM COMPLETE
Area-P 1/2: 3.27 cm2
S' Lateral: 3 cm

## 2021-03-07 ENCOUNTER — Encounter: Payer: Self-pay | Admitting: Cardiology

## 2021-03-09 ENCOUNTER — Encounter: Payer: Self-pay | Admitting: Cardiology

## 2021-03-10 ENCOUNTER — Encounter: Payer: Self-pay | Admitting: Cardiology

## 2021-03-12 ENCOUNTER — Encounter: Payer: Self-pay | Admitting: Cardiology

## 2021-03-12 DIAGNOSIS — Z23 Encounter for immunization: Secondary | ICD-10-CM | POA: Diagnosis not present

## 2021-03-12 DIAGNOSIS — L814 Other melanin hyperpigmentation: Secondary | ICD-10-CM | POA: Diagnosis not present

## 2021-03-12 DIAGNOSIS — Q825 Congenital non-neoplastic nevus: Secondary | ICD-10-CM | POA: Diagnosis not present

## 2021-03-12 DIAGNOSIS — L821 Other seborrheic keratosis: Secondary | ICD-10-CM | POA: Diagnosis not present

## 2021-03-12 DIAGNOSIS — D225 Melanocytic nevi of trunk: Secondary | ICD-10-CM | POA: Diagnosis not present

## 2021-03-12 DIAGNOSIS — L578 Other skin changes due to chronic exposure to nonionizing radiation: Secondary | ICD-10-CM | POA: Diagnosis not present

## 2021-03-14 ENCOUNTER — Encounter: Payer: Self-pay | Admitting: Cardiology

## 2021-03-15 ENCOUNTER — Encounter: Payer: Self-pay | Admitting: Cardiology

## 2021-03-17 ENCOUNTER — Encounter: Payer: Self-pay | Admitting: Cardiology

## 2021-03-18 ENCOUNTER — Encounter: Payer: Self-pay | Admitting: Cardiology

## 2021-03-19 ENCOUNTER — Encounter: Payer: Self-pay | Admitting: Cardiology

## 2021-03-20 ENCOUNTER — Encounter: Payer: Self-pay | Admitting: Cardiology

## 2021-03-22 ENCOUNTER — Encounter: Payer: Self-pay | Admitting: Cardiology

## 2021-03-24 ENCOUNTER — Encounter: Payer: Self-pay | Admitting: Cardiology

## 2021-03-26 ENCOUNTER — Encounter: Payer: Self-pay | Admitting: Cardiology

## 2021-03-27 ENCOUNTER — Encounter: Payer: Self-pay | Admitting: Cardiology

## 2021-03-28 ENCOUNTER — Encounter: Payer: Self-pay | Admitting: Cardiology

## 2021-04-30 DIAGNOSIS — K635 Polyp of colon: Secondary | ICD-10-CM | POA: Diagnosis not present

## 2021-04-30 DIAGNOSIS — K649 Unspecified hemorrhoids: Secondary | ICD-10-CM | POA: Diagnosis not present

## 2021-04-30 DIAGNOSIS — Z86010 Personal history of colonic polyps: Secondary | ICD-10-CM | POA: Diagnosis not present

## 2021-04-30 DIAGNOSIS — K5730 Diverticulosis of large intestine without perforation or abscess without bleeding: Secondary | ICD-10-CM | POA: Diagnosis not present

## 2021-04-30 DIAGNOSIS — Z1211 Encounter for screening for malignant neoplasm of colon: Secondary | ICD-10-CM | POA: Diagnosis not present

## 2021-04-30 DIAGNOSIS — D125 Benign neoplasm of sigmoid colon: Secondary | ICD-10-CM | POA: Diagnosis not present

## 2021-06-28 ENCOUNTER — Telehealth: Payer: Self-pay | Admitting: Cardiology

## 2021-06-28 NOTE — Telephone Encounter (Signed)
Patient called to inform he has moved and will be established at another office.

## 2021-07-03 ENCOUNTER — Ambulatory Visit: Payer: Medicare HMO | Admitting: Cardiology

## 2021-12-17 ENCOUNTER — Other Ambulatory Visit: Payer: Self-pay | Admitting: Surgery

## 2021-12-17 DIAGNOSIS — I7121 Aneurysm of the ascending aorta, without rupture: Secondary | ICD-10-CM

## 2022-01-23 ENCOUNTER — Ambulatory Visit: Payer: Medicare Other | Admitting: Surgery

## 2022-06-12 IMAGING — CT CT ANGIO CHEST
2 of 6 series · 13 of 36 positions shown · IV contrast (iopamidol)
Comparison: 11/10/2019, 09/30/2018, 03/20/2018.

CLINICAL DATA: Thoracic aortic aneurysm follow-up

EXAM:
CT ANGIOGRAPHY CHEST WITH CONTRAST
TECHNIQUE: Multidetector CT imaging of the chest was performed using the
standard protocol during bolus administration of intravenous
contrast. Multiplanar CT image reconstructions and MIPs were
obtained to evaluate the vascular anatomy.
CONTRAST:  75mL M67PI7-YUO IOPAMIDOL (M67PI7-YUO) INJECTION 76%

[Series 4: cta thorax 2.00 bv36 s3 axial arterial · axial · arterial · 0.66mm/px · z∈[+1564,+1843]mm · 12 of 166 slices shown]
[im 13/166  lung]
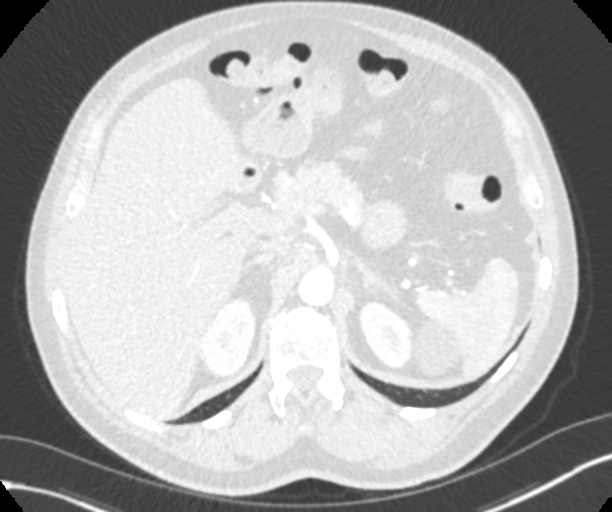
[im 26/166  mediastinal]
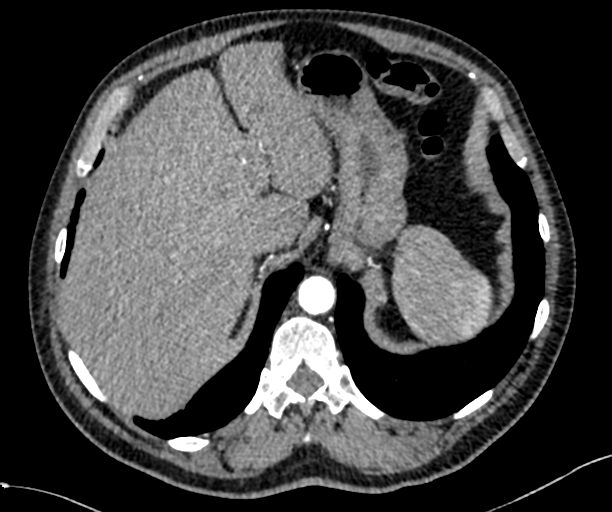
[im 39/166  lung]
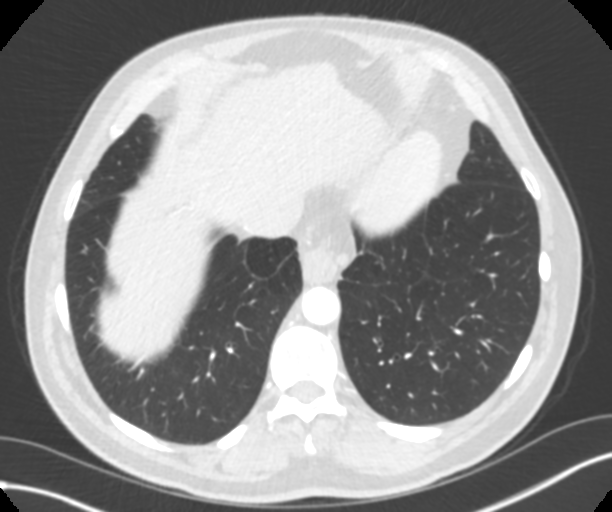
[im 51/166  mediastinal]
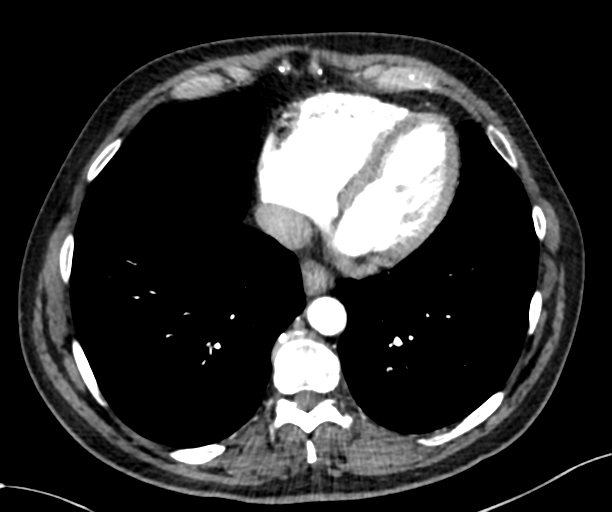
[im 64/166  lung]
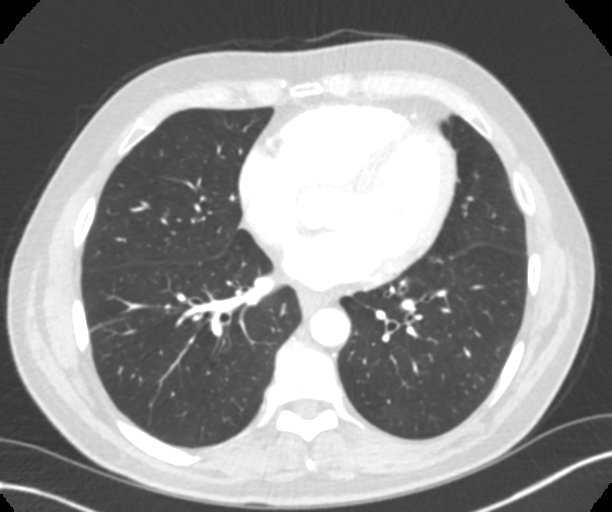
[im 77/166  mediastinal]
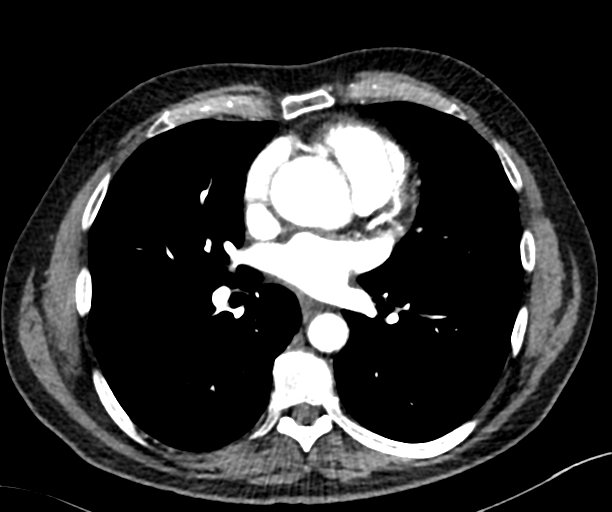
[im 89/166  lung]
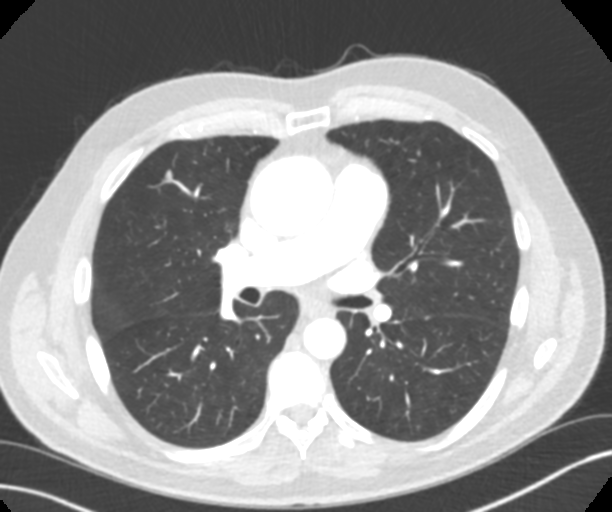
[im 102/166  mediastinal]
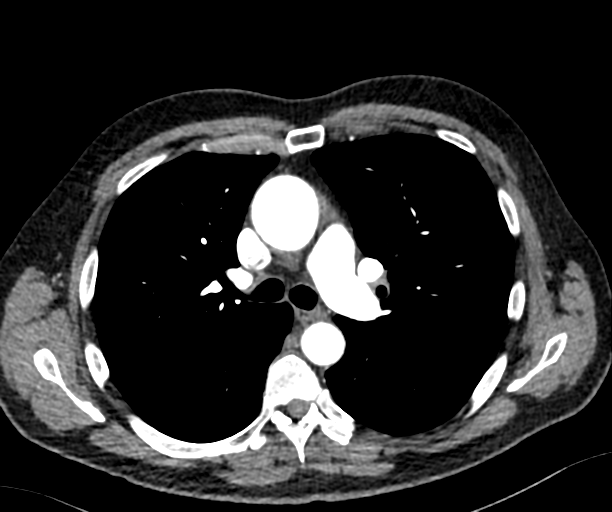
[im 115/166  lung]
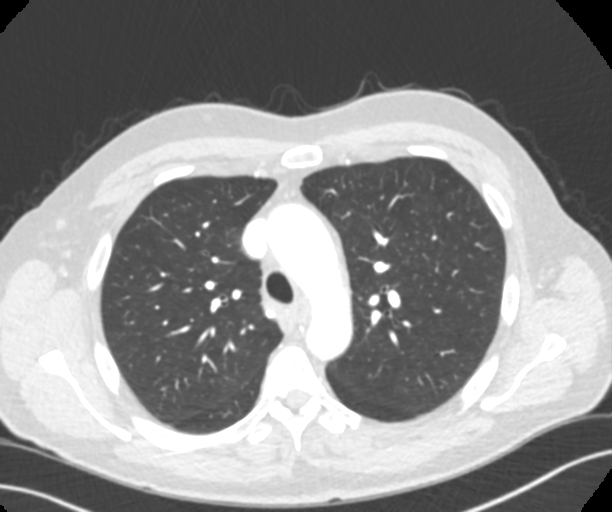
[im 127/166  mediastinal]
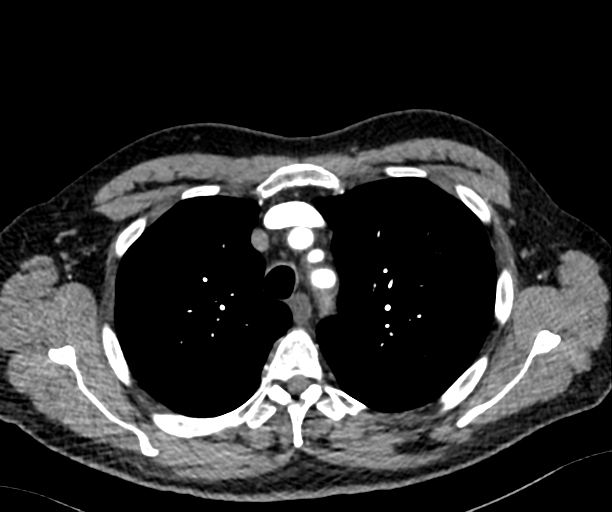
[im 140/166  lung]
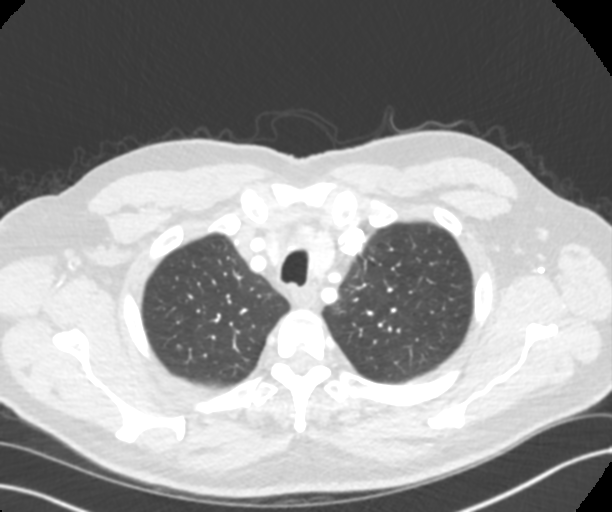
[im 153/166  mediastinal]
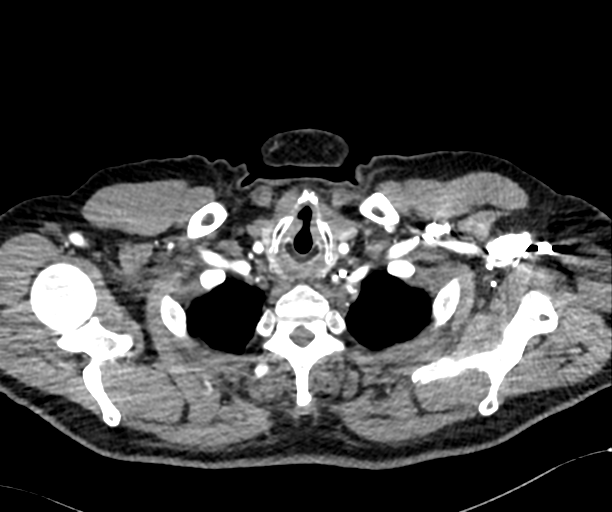

[Series 9: cta thorax 2.00 bv36 s3 cor st · coronal · 0.72mm/px · 1 of 169 slices shown]
[im 85/169  mediastinal]
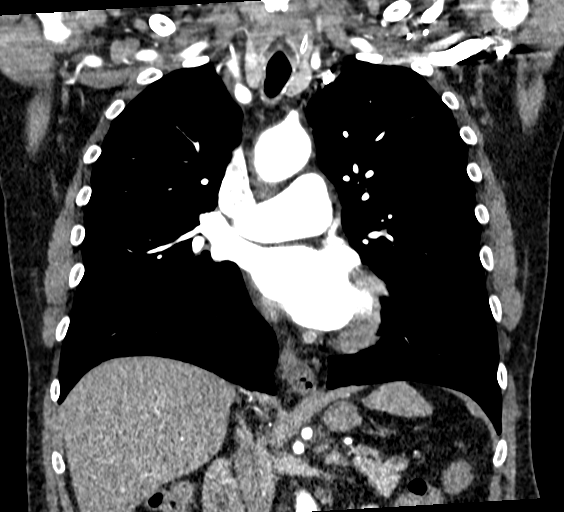

[13 of 36 positions shown; findings below may reference images not displayed]

FINDINGS: Cardiovascular: Maximal diameter of the ascending aorta measured
along the axis of the vessel is 4.8 x 5.0 cm , unchanged since the
study of 03/20/2018.

Heart size is normal. No visible coronary artery calcification. Mild
atherosclerotic calcification at the aortic arch. Branching pattern
is normal without brachiocephalic vessel origin stenosis.

Mediastinum/Nodes: No mass or adenopathy.

Lungs/Pleura: Tiny sub fissural pulmonary nodules are unchanged
since 03/20/2018 and do not require further follow-up. Minimal
pleural and parenchymal scarring at both apices. Few insignificant
blebs in the right lower lobe, the largest measuring 1 cm. No
pleural abnormality.

Upper Abdomen: 2 cm low-density in the left lobe of the liver is
unchanged from prior studies and quite likely benign. Upper pole
cyst of the left kidney.

Musculoskeletal: No significant finding.

Review of the MIP images confirms the above findings.
IMPRESSION: Stable examination. Maximal diameter of the ascending aorta measures
4.8 x 5.0 cm, unchanged since the initial study of 03/20/2018.
Recommend semi-annual imaging followup by CTA or MRA and referral to
cardiothoracic surgery if not already obtained. This recommendation
follows 2535 ACCF/AHA/AATS/ACR/ASA/SCA/HICHA/MANIKUDDIN/XHENTIANA/PSTRICIA Guidelines
for the Diagnosis and Management of Patients With Thoracic Aortic
Disease. Circulation. 2535; 121: E266-e369. Aortic aneurysm NOS
(4F1HD-CUV.6)

Aortic Atherosclerosis (4F1HD-FCF.F).

Tiny stable subpleural nodules, unchanged since 03/20/2018. No
additional follow-up suggested based on appearance and stability.

Stable 2 cm low-density of the left lobe of the liver, certainly
benign.
